# Patient Record
Sex: Male | Born: 1985 | Race: White | Hispanic: No | Marital: Single | Smoking: Current every day smoker
Health system: Southern US, Community
[De-identification: ages and names within clinical notes are randomized; demographics above are authoritative.]

## PROBLEM LIST (undated history)

## (undated) DIAGNOSIS — R569 Unspecified convulsions: Secondary | ICD-10-CM

---

## 2005-05-25 ENCOUNTER — Emergency Department: Payer: Self-pay | Admitting: Emergency Medicine

## 2005-07-03 ENCOUNTER — Emergency Department: Payer: Self-pay | Admitting: Unknown Physician Specialty

## 2006-04-10 ENCOUNTER — Emergency Department: Payer: Self-pay | Admitting: Emergency Medicine

## 2006-04-17 ENCOUNTER — Ambulatory Visit: Payer: Self-pay | Admitting: Orthopaedic Surgery

## 2006-08-28 ENCOUNTER — Emergency Department: Payer: Self-pay | Admitting: Emergency Medicine

## 2007-01-29 ENCOUNTER — Emergency Department: Payer: Self-pay | Admitting: Emergency Medicine

## 2007-01-29 ENCOUNTER — Emergency Department: Payer: Self-pay | Admitting: Unknown Physician Specialty

## 2009-03-25 ENCOUNTER — Emergency Department: Payer: Self-pay | Admitting: Emergency Medicine

## 2011-05-15 ENCOUNTER — Emergency Department: Payer: Self-pay | Admitting: Emergency Medicine

## 2012-11-03 ENCOUNTER — Emergency Department: Payer: Self-pay | Admitting: Emergency Medicine

## 2012-11-03 LAB — BASIC METABOLIC PANEL
Anion Gap: 3 — ABNORMAL LOW (ref 7–16)
Calcium, Total: 8.9 mg/dL (ref 8.5–10.1)
Co2: 29 mmol/L (ref 21–32)
EGFR (African American): >60
EGFR (Non-African Amer.): >60
Osmolality: 280 (ref 275–301)
Sodium: 140 mmol/L (ref 136–145)

## 2012-11-03 LAB — CBC WITH DIFFERENTIAL/PLATELET
Basophil #: 0.2 10*3/uL — ABNORMAL HIGH (ref 0.0–0.1)
Basophil %: 1.2 %
Eosinophil #: 0.1 10*3/uL (ref 0.0–0.7)
HCT: 43.4 % (ref 40.0–52.0)
MCH: 31.5 pg (ref 26.0–34.0)
MCV: 93 fL (ref 80–100)
Monocyte #: 0.7 x10 3/mm (ref 0.2–1.0)
Neutrophil %: 83.1 %
RBC: 4.66 10*6/uL (ref 4.40–5.90)
RDW: 12.4 % (ref 11.5–14.5)
WBC: 12.9 10*3/uL — ABNORMAL HIGH (ref 3.8–10.6)

## 2016-05-12 ENCOUNTER — Encounter: Payer: Self-pay | Admitting: Emergency Medicine

## 2016-05-12 ENCOUNTER — Emergency Department: Payer: Managed Care, Other (non HMO)

## 2016-05-12 ENCOUNTER — Emergency Department
Admission: EM | Admit: 2016-05-12 | Discharge: 2016-05-12 | Disposition: A | Discharge status: Home / Self Care | Payer: Managed Care, Other (non HMO) | Attending: Emergency Medicine | Admitting: Emergency Medicine

## 2016-05-12 DIAGNOSIS — Y9289 Other specified places as the place of occurrence of the external cause: Secondary | ICD-10-CM | POA: Diagnosis not present

## 2016-05-12 DIAGNOSIS — F17210 Nicotine dependence, cigarettes, uncomplicated: Secondary | ICD-10-CM | POA: Diagnosis not present

## 2016-05-12 DIAGNOSIS — Y939 Activity, unspecified: Secondary | ICD-10-CM | POA: Insufficient documentation

## 2016-05-12 DIAGNOSIS — Y999 Unspecified external cause status: Secondary | ICD-10-CM | POA: Insufficient documentation

## 2016-05-12 DIAGNOSIS — S0990XA Unspecified injury of head, initial encounter: Secondary | ICD-10-CM | POA: Diagnosis present

## 2016-05-12 DIAGNOSIS — W01118A Fall on same level from slipping, tripping and stumbling with subsequent striking against other sharp object, initial encounter: Secondary | ICD-10-CM | POA: Insufficient documentation

## 2016-05-12 DIAGNOSIS — S060X0A Concussion without loss of consciousness, initial encounter: Secondary | ICD-10-CM | POA: Insufficient documentation

## 2016-05-12 HISTORY — DX: Unspecified convulsions: R56.9

## 2016-05-12 MED ORDER — ONDANSETRON 4 MG PO TBDP
4.0000 mg | ORAL_TABLET | Freq: Once | ORAL | Status: AC
  Administered 2016-05-12: 4 mg via ORAL
  Filled 2016-05-12: qty 1

## 2016-05-12 MED ORDER — PROMETHAZINE HCL 25 MG PO TABS: 25.0000 mg | ORAL_TABLET | Freq: Four times a day (QID) | ORAL | 0 refills | Status: DC | PRN

## 2016-05-12 NOTE — ED Provider Notes (Signed)
[facility] Emergency Department Provider Note        Time seen: ----------------------------------------- 10:14 PM on 05/12/2016 -----------------------------------------    I have reviewed the triage vital signs and the nursing notes.   HISTORY  Chief Complaint Fall    HPI Kyle Walls is a 30 y.o. male who presents the ER after he tripped and fell backwards striking the back of his head on a gravel driveway.Patient did not lose consciousness but was very disoriented and uncoordinated and after the injury that happened approximately one hour ago. Kyle Walls states he seems a little better now, he can ambulate without difficulty. Patient does complain of nausea. He struck the back of the head with this fall.   Past Medical History:  Diagnosis Date  . Seizures (HCC)     There are no active problems to display for this patient.   History reviewed. No pertinent surgical history.  Allergies Review of patient's allergies indicates no known allergies.  Social History Social History  Substance Use Topics  . Smoking status: Current Every Day Smoker    Packs/day: 1.00    Years: 15.00    Types: Cigarettes  . Smokeless tobacco: Never Used  . Alcohol use No    Review of Systems Constitutional: Negative for fever. Cardiovascular: Negative for chest pain. Respiratory: Negative for shortness of breath. Gastrointestinal: Negative for abdominal pain, Positive for nausea Genitourinary: Negative for dysuria. Musculoskeletal: Negative for back pain. Skin: Negative for rash. Neurological: Positive for headache  10-point ROS otherwise negative.  ____________________________________________   PHYSICAL EXAM:  VITAL SIGNS: ED Triage Vitals  Enc Vitals Group     BP 05/12/16 2158 110/69     Pulse Rate 05/12/16 2158 (!) 59     Resp 05/12/16 2158 16     Temp 05/12/16 2158 97.6 F (36.4 C)     Temp Source 05/12/16 2158 Oral     SpO2 05/12/16 2158  100 %     Weight 05/12/16 2201 105 lb (47.6 kg)     Height 05/12/16 2201 5\' 8"  (1.727 m)     Head Circumference --      Peak Flow --      Pain Score 05/12/16 2202 0     Pain Loc --      Pain Edu? --      Excl. in GC? --     Constitutional: Alert and oriented. Well appearing and in no distress. Eyes: Conjunctivae are normal. PERRL. Normal extraocular movements. ENT   Head: Normocephalic, posterior midline parietal occipital scalp hematoma   Nose: No congestion/rhinnorhea.   Mouth/Throat: Mucous membranes are moist.   Neck: No stridor. Cardiovascular: Normal rate, regular rhythm. No murmurs, rubs, or gallops. Respiratory: Normal respiratory effort without tachypnea nor retractions. Breath sounds are clear and equal bilaterally. No wheezes/rales/rhonchi. Gastrointestinal: Soft and nontender. Normal bowel sounds Musculoskeletal: Nontender with normal range of motion in all extremities. No lower extremity tenderness nor edema. Neurologic:  Normal speech and language. No gross focal neurologic deficits are appreciated.  Skin:  Skin is warm, dry and intact. No rash noted. Psychiatric: Mood and affect are normal. Speech and behavior are normal.  ____________________________________________  ED COURSE:  Pertinent labs & imaging results that were available during my care of the patient were reviewed by me and considered in my medical decision making (see chart for details). Clinical Course  Patient is in no distress, clinically with concussion. We will assess with CT imaging.  Procedures ____________________________________________   RADIOLOGY Images  were viewed by me  CT head is unremarkable  ____________________________________________  FINAL ASSESSMENT AND PLAN  Concussion  Plan: Patient with a concussion based on his symptoms. He is still having some nausea, he was given oral Zofran. I have given him a work excuse, they're advised to return for worsening or  worrisome symptoms.   Emily Filbert, MD   Note: This dictation was prepared with Dragon dictation. Any transcriptional errors that result from this process are unintentional    Emily Filbert, MD 05/12/16 2248

## 2016-05-12 NOTE — ED Triage Notes (Signed)
Fiance says pt got tripped by his dog in the driveway; fell backwards, striking the back of his head on gravel driveway; pt was disoriented after the fall; no laceration to area; fiance says pt seems a little better, but still not his normal self; history of seizures but has not had one tonight;

## 2016-05-12 NOTE — ED Notes (Signed)
See triage note. Pt noted to be leaning over comode, states he was feeling nauseated after CT. Refuses anti-emetic at this time. No emesis noted. Denies confusion or dizziness at this time.

## 2016-05-12 NOTE — ED Notes (Signed)

## 2017-03-07 IMAGING — CT CT HEAD W/O CM
3 series · 15 of 46 positions shown, 18 images · non-contrast
Comparison: 05/25/2005 head CT.

CLINICAL DATA: Fall with posterior head injury. Disorientation.
History of seizures.

EXAM:
CT HEAD WITHOUT CONTRAST
TECHNIQUE: Contiguous axial images were obtained from the base of the skull
through the vertex without intravenous contrast.

[Series 2: head wo · axial · 0.47mm/px · z∈[-110,+10]mm · 9 of 29 slices shown, 12 images]
[im 3/29  brain]
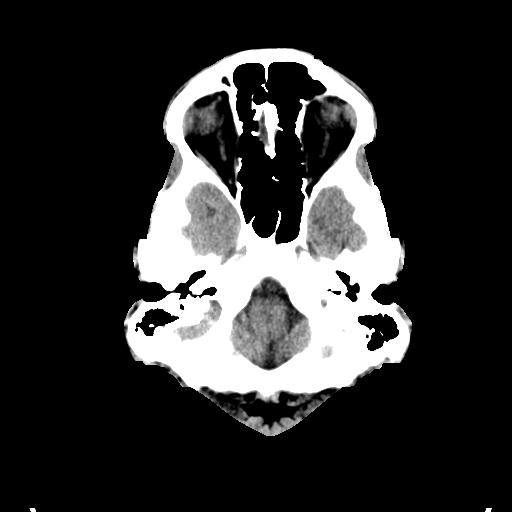
[im 3/29  bone]
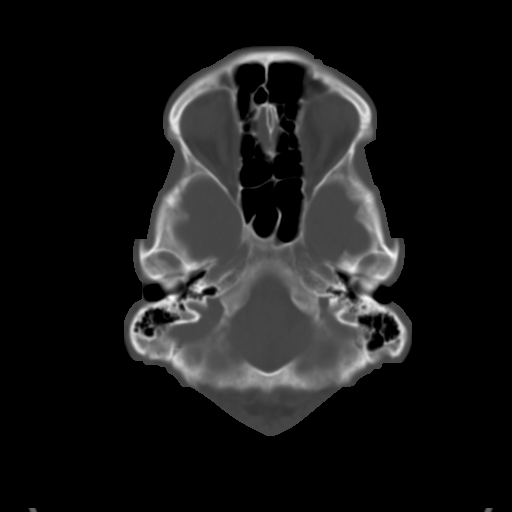
[im 6/29  brain]
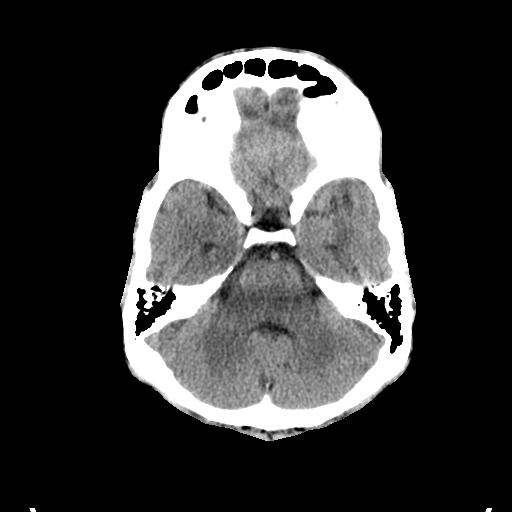
[im 9/29  brain]
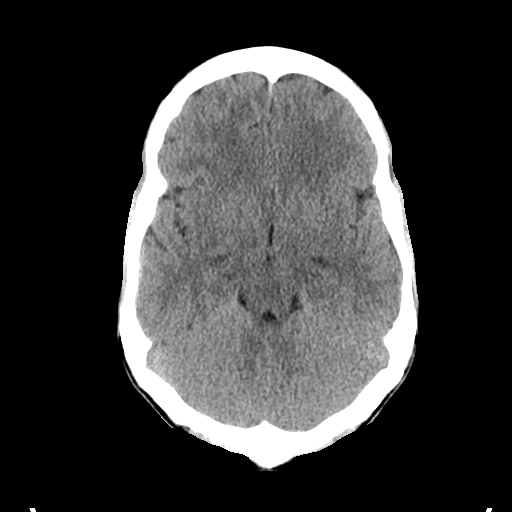
[im 12/29  brain]
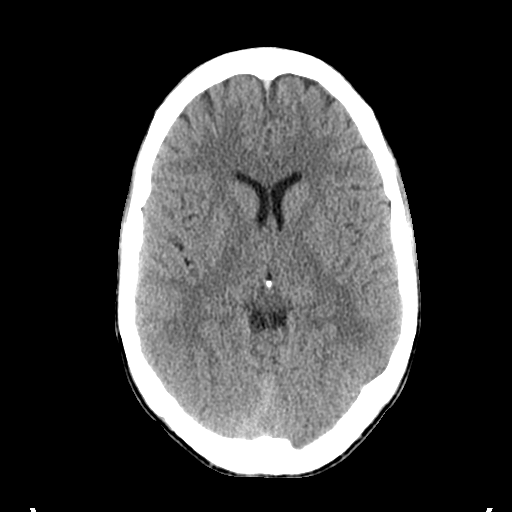
[im 15/29  brain]
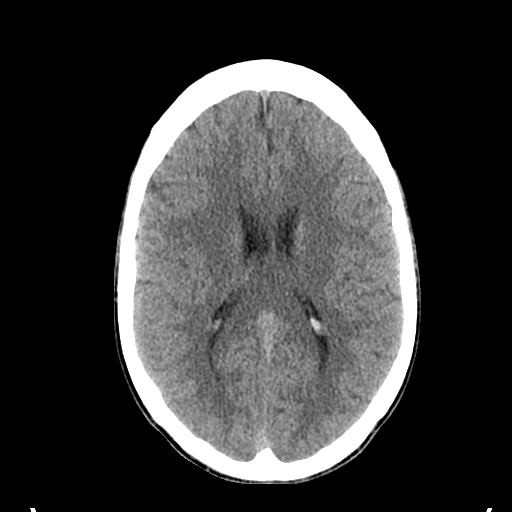
[im 15/29  bone]
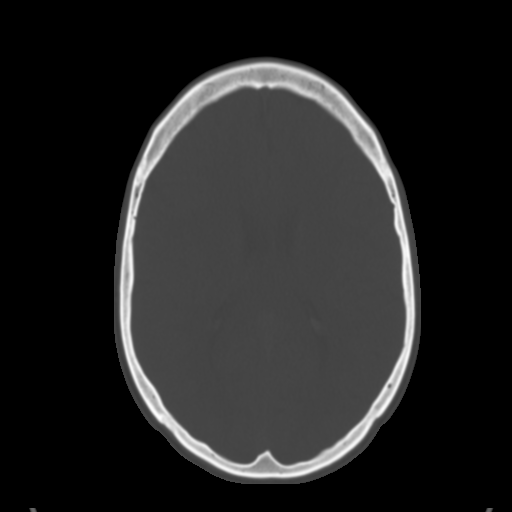
[im 18/29  brain]
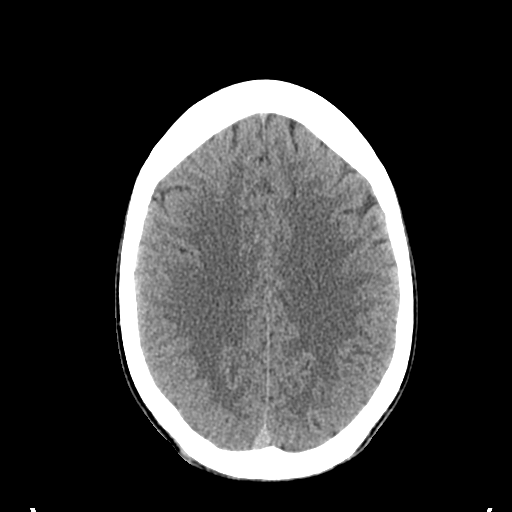
[im 21/29  brain]
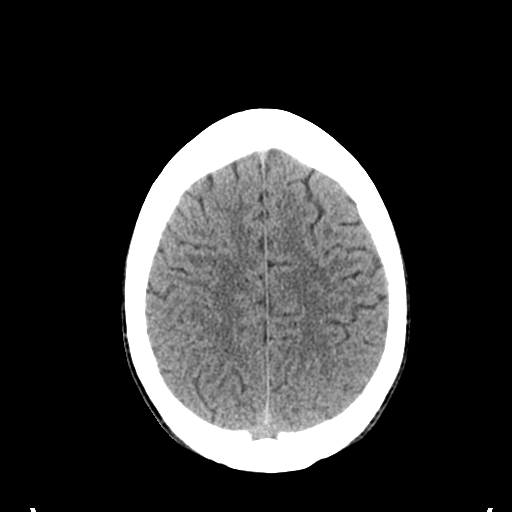
[im 24/29  brain]
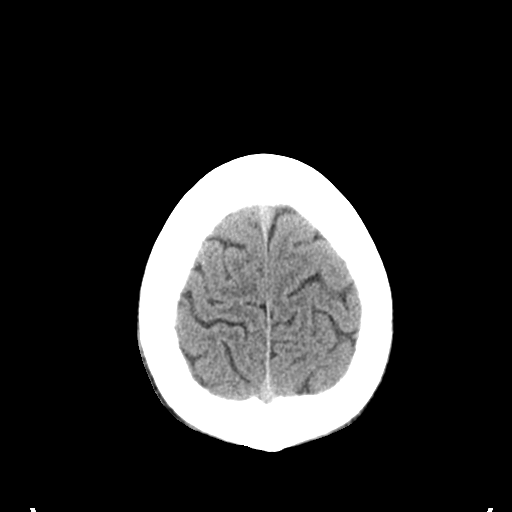
[im 27/29  brain]
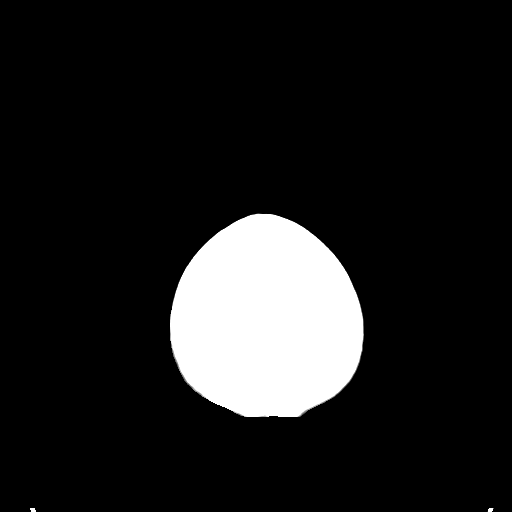
[im 27/29  bone]
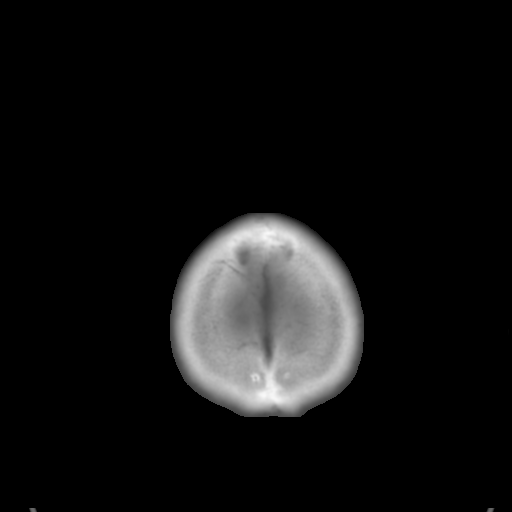

[Series 4: coronal soft tissue · coronal · 0.28mm/px · 3 of 69 slices shown]
[im 23/69  brain]
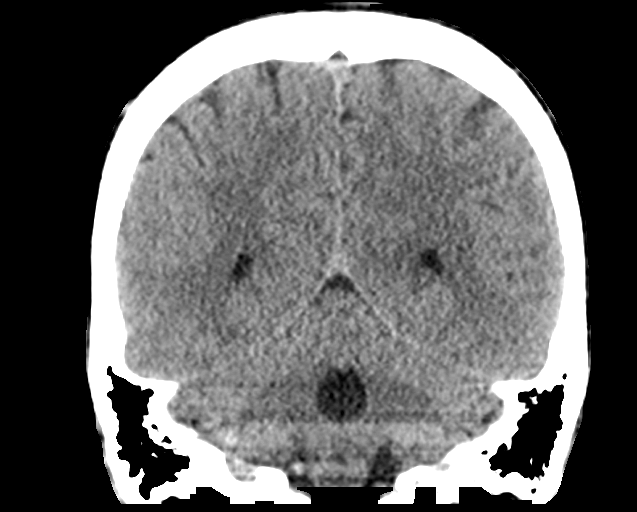
[im 31/69  brain]
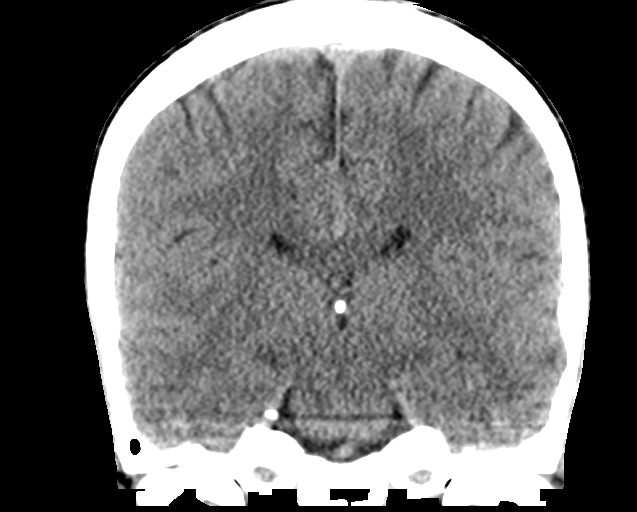
[im 38/69  brain]
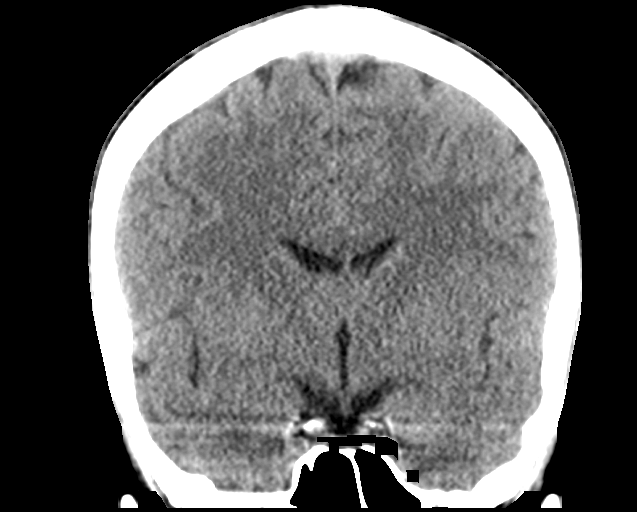

[Series 5: sagittal soft tissue · sagittal · 0.29mm/px · 3 of 51 slices shown]
[im 17/51  brain]
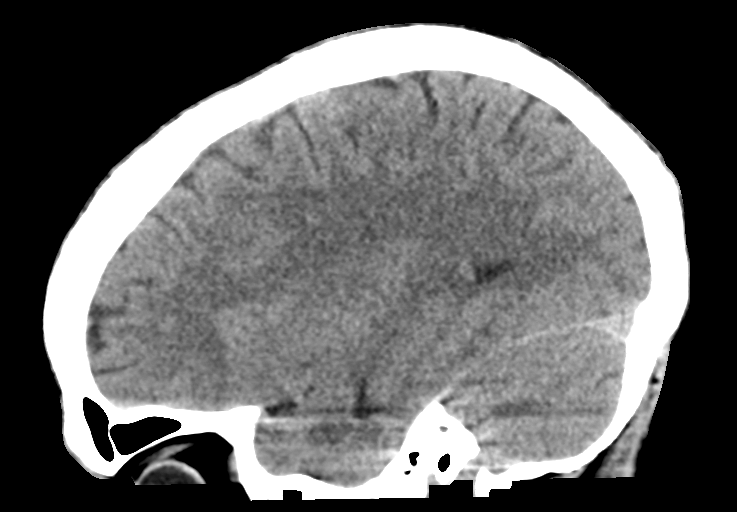
[im 26/51  brain]
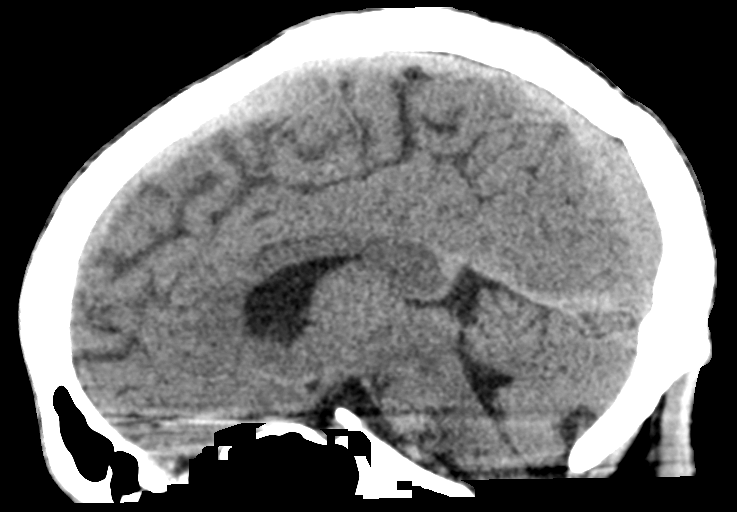
[im 34/51  brain]
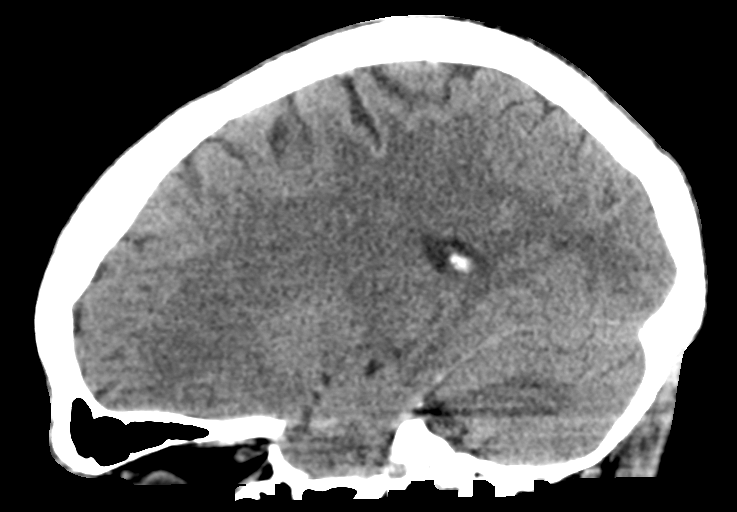

[15 of 46 positions shown; findings below may reference images not displayed]

FINDINGS: Brain: No evidence of parenchymal hemorrhage or extra-axial fluid
collection. No mass lesion, mass effect, or midline shift. No CT
evidence of acute infarction. Cerebral volume is age appropriate. No
ventriculomegaly.

Vascular: No hyperdense vessel or unexpected calcification.

Skull: No evidence of calvarial fracture.

Sinuses/Orbits: The visualized paranasal sinuses are essentially
clear.

Other:  The mastoid air cells are unopacified.
IMPRESSION: Negative head CT. No evidence of acute intracranial abnormality. No
evidence of calvarial fracture.

## 2022-02-27 ENCOUNTER — Emergency Department
Admission: EM | Admit: 2022-02-27 | Discharge: 2022-02-27 | Disposition: A | Discharge status: Home / Self Care | Payer: Self-pay | Attending: Emergency Medicine | Admitting: Emergency Medicine

## 2022-02-27 ENCOUNTER — Emergency Department: Payer: Self-pay

## 2022-02-27 ENCOUNTER — Other Ambulatory Visit: Payer: Self-pay

## 2022-02-27 ENCOUNTER — Encounter: Payer: Self-pay | Admitting: Emergency Medicine

## 2022-02-27 DIAGNOSIS — R079 Chest pain, unspecified: Secondary | ICD-10-CM

## 2022-02-27 DIAGNOSIS — J40 Bronchitis, not specified as acute or chronic: Secondary | ICD-10-CM | POA: Insufficient documentation

## 2022-02-27 DIAGNOSIS — R0602 Shortness of breath: Secondary | ICD-10-CM

## 2022-02-27 LAB — BASIC METABOLIC PANEL
Anion gap: 12 (ref 5–15)
BUN: 13 mg/dL (ref 6–20)
CO2: 19 mmol/L — ABNORMAL LOW (ref 22–32)
Calcium: 9.2 mg/dL (ref 8.9–10.3)
Chloride: 106 mmol/L (ref 98–111)
Creatinine, Ser: 0.95 mg/dL (ref 0.61–1.24)
GFR, Estimated: >60 mL/min (ref >60)
Glucose, Bld: 162 mg/dL — ABNORMAL HIGH (ref 70–99)
Potassium: 3 mmol/L — ABNORMAL LOW (ref 3.5–5.1)
Sodium: 137 mmol/L (ref 135–145)

## 2022-02-27 LAB — CBC
HCT: 41.8 % (ref 39.0–52.0)
Hemoglobin: 14.1 g/dL (ref 13.0–17.0)
MCH: 30 pg (ref 26.0–34.0)
MCHC: 33.7 g/dL (ref 30.0–36.0)
MCV: 88.9 fL (ref 80.0–100.0)
Platelets: 150 10*3/uL (ref 150–400)
RBC: 4.7 MIL/uL (ref 4.22–5.81)
RDW: 12 % (ref 11.5–15.5)
WBC: 7.3 10*3/uL (ref 4.0–10.5)
nRBC: 0 % (ref 0.0–0.2)

## 2022-02-27 LAB — TROPONIN I (HIGH SENSITIVITY): Troponin I (High Sensitivity): <2 ng/L (ref <18)

## 2022-02-27 MED ORDER — DOXYCYCLINE HYCLATE 50 MG PO CAPS: 100.0000 mg | ORAL_CAPSULE | Freq: Two times a day (BID) | ORAL | 0 refills | Status: AC

## 2022-02-27 MED ORDER — PREDNISONE 10 MG (21) PO TBPK: ORAL_TABLET | ORAL | 0 refills | Status: AC

## 2022-02-27 MED ORDER — AEROCHAMBER PLUS FLO-VU LARGE MISC
1.0000 | Freq: Once | Status: AC
  Administered 2022-02-27: 1
  Filled 2022-02-27 (×3): qty 1

## 2022-02-27 MED ORDER — ONDANSETRON 8 MG PO TBDP
8.0000 mg | ORAL_TABLET | Freq: Once | ORAL | Status: AC
  Administered 2022-02-27: 8 mg via ORAL
  Filled 2022-02-27: qty 1

## 2022-02-27 MED ORDER — ALBUTEROL SULFATE HFA 108 (90 BASE) MCG/ACT IN AERS
2.0000 | INHALATION_SPRAY | Freq: Once | RESPIRATORY_TRACT | Status: AC
  Administered 2022-02-27: 2 via RESPIRATORY_TRACT
  Filled 2022-02-27: qty 6.7

## 2022-02-27 NOTE — ED Provider Notes (Signed)
[facility] Provider Note   Event Date/Time   First MD Initiated Contact with Patient 02/27/22 [id number]     (approximate) History  Chest Pain  HPI Kyle Walls is a 36 y.o. male  Location: Chest Duration: Began this morning Timing: Resolving Severity: Severe Quality: Shortness of breath Context: Patient states that he is at upper respiratory symptoms including congestion and cough over the past 5 days that culminated in an acute episode of shortness of breath this morning that has resolved Modifying factors: Any movement worsens his shortness of breath and it partially resolved at rest Associated Symptoms: Nasal congestion, productive cough of yellow sputum, generalized weakness, chest pain ROS: Patient currently denies any vision changes, tinnitus, difficulty speaking, facial droop, sore throat, abdominal pain, nausea/vomiting/diarrhea, dysuria, or numbness/paresthesias in any extremity   Physical Exam  Triage Vital Signs: ED Triage Vitals  Enc Vitals Group     BP 02/27/22 0857 91/61     Pulse Rate 02/27/22 0857 73     Resp 02/27/22 0857 18     Temp --      Temp src --      SpO2 02/27/22 0857 100 %     Weight 02/27/22 0855 110 lb (49.9 kg)     Height 02/27/22 0855 5\' 6"  (1.676 m)     Head Circumference --      Peak Flow --      Pain Score 02/27/22 0854 0     Pain Loc --      Pain Edu? --      Excl. in GC? --    Most recent vital signs: Vitals:   02/27/22 0857  BP: 91/61  Pulse: 73  Resp: 18  SpO2: 100%   General: Awake, oriented x4. CV:  Good peripheral perfusion.  Resp:  Normal effort.  Expiratory wheezes over bilateral lung fields Abd:  No distention.  Other:  Middle-aged Caucasian male laying in bed in no acute distress ED Results / Procedures / Treatments  Labs (all labs ordered are listed, but only abnormal results are displayed) Labs Reviewed  BASIC METABOLIC PANEL - Abnormal; Notable for the following components:      Result  Value   Potassium 3.0 (*)    CO2 19 (*)    Glucose, Bld 162 (*)    All other components within normal limits  CBC  TROPONIN I (HIGH SENSITIVITY)   EKG ED ECG REPORT I, 04/30/22, the attending physician, personally viewed and interpreted this ECG. Date: 02/27/2022 EKG Time: 0856 Rate: 76 Rhythm: normal sinus rhythm QRS Axis: normal Intervals: normal ST/T Wave abnormalities: normal Narrative Interpretation: no evidence of acute ischemia RADIOLOGY ED MD interpretation: One-view portable chest x-ray interpreted by me shows no evidence of acute abnormalities including no pneumonia, pneumothorax, or widened mediastinum -Agree with radiology assessment Official radiology report(s): DG Chest 2 View  Result Date: 02/27/2022 CLINICAL DATA:  Chest pain and shortness of breath beginning 1 hour ago. Vomiting. EXAM: CHEST - 2 VIEW COMPARISON:  None Available. FINDINGS: The heart size and mediastinal contours are within normal limits. Bilateral central peribronchial thickening noted, however both lungs are otherwise clear. The visualized skeletal structures are unremarkable. IMPRESSION: No active cardiopulmonary disease. Electronically Signed   By: 04/30/2022 M.D.   On: 02/27/2022 09:15   PROCEDURES: Critical Care performed: No .1-3 Lead EKG Interpretation  Performed by: 04/30/2022, MD Authorized by: Merwyn Katos, MD     Interpretation: normal  ECG rate:  72   ECG rate assessment: normal     Rhythm: sinus rhythm     Ectopy: none     Conduction: normal    MEDICATIONS ORDERED IN ED: Medications  albuterol (VENTOLIN HFA) 108 (90 Base) MCG/ACT inhaler 2 puff (has no administration in time range)  AeroChamber Plus Flo-Vu Medium MISC 1 each (has no administration in time range)  ondansetron (ZOFRAN-ODT) disintegrating tablet 8 mg (8 mg Oral Given 02/27/22 0939)   IMPRESSION / MDM / ASSESSMENT AND PLAN / ED COURSE  I reviewed the triage vital signs and the nursing notes.                              The patient is on the cardiac monitor to evaluate for evidence of arrhythmia and/or significant heart rate changes. Patient's presentation is most consistent with acute presentation with potential threat to life or bodily function. The patient appears to be suffering from a moderate exacerbation of COPD with likely bronchitis  Based on the history, exam, CXR/EKG, and further workup I dont suspect any other emergent cause of this presentation, such as pneumonia, acute coronary syndrome, congestive heart failure, pulmonary embolism, or pneumothorax.  ED Interventions: bronchodilators, steroids, antibiotics, reassess  1012 Reassessment: After treatment, the patients shortness of breath is resolved, and their lung exam has returned to baseline. They are comfortable and want to go home.  Rx: Steroids, Antibiotics, Albuterol Disposition: Discharge home with SRP. PCP follow up recommended in next 48hours.   FINAL CLINICAL IMPRESSION(S) / ED DIAGNOSES   Final diagnoses:  Bronchitis with acute wheezing  Nonspecific chest pain  Shortness of breath   Rx / DC Orders   ED Discharge Orders          Ordered    doxycycline (VIBRAMYCIN) 50 MG capsule  2 times daily        02/27/22 0947    predniSONE (STERAPRED UNI-PAK 21 TAB) 10 MG (21) TBPK tablet        02/27/22 6314           Note:  This document was prepared using Dragon voice recognition software and may include unintentional dictation errors.   Merwyn Katos, MD 02/27/22 1032

## 2022-02-27 NOTE — ED Notes (Signed)
See triage note  Presents with some SOB and chest discomfort this am  States he did have some n/v  Denies any fever

## 2022-02-27 NOTE — ED Notes (Signed)
Patient to xray.

## 2022-02-27 NOTE — ED Triage Notes (Addendum)
Patient to ED via POV for chest tightness with SOB that started approx 1 hour ago. Patient states N/D with pain that does not radiate. Patient states "I feel anxious."
